# Patient Record
Sex: Male | Born: 1935 | Race: White | Hispanic: No | Marital: Married | Smoking: Never smoker
Health system: Western US, Academic
[De-identification: ages and names within clinical notes are randomized; demographics above are authoritative.]

---

## 2015-06-07 ENCOUNTER — Ambulatory Visit
Admit: 2015-06-07 | Discharge: 2015-06-07 | Payer: MEDICARE | Attending: Ophthalmology | Primary: Student in an Organized Health Care Education/Training Program

## 2015-06-07 DIAGNOSIS — H259 Unspecified age-related cataract: Secondary | ICD-10-CM

## 2015-06-07 MED ORDER — PHENYLEPHRINE HCL 2.5 % OP SOLN
2.5 % | Freq: Once | OPHTHALMIC | Status: AC
Start: 2015-06-07 — End: 2015-06-07
  Administered 2015-06-07: 19:00:00 1 [drp] via OPHTHALMIC

## 2015-06-07 MED ORDER — FLUORESCEIN-BENOXINATE 0.25-0.4 % OP SOLN
Freq: Once | OPHTHALMIC | Status: AC
Start: 2015-06-07 — End: 2015-06-07
  Administered 2015-06-07: 19:00:00 1 [drp] via OPHTHALMIC

## 2015-06-07 MED ORDER — TROPICAMIDE 1 % OP SOLN
1 % | Freq: Once | OPHTHALMIC | Status: AC
Start: 2015-06-07 — End: 2015-06-07
  Administered 2015-06-07: 19:00:00 1 [drp] via OPHTHALMIC

## 2015-06-07 NOTE — Progress Notes (Signed)
OPHTHALMOLOGY VISIT  Full Eye Exam    Date of Service:  06/07/2015  Primary Care Provider:  No primary care provider on file.    Patient Active Problem List    Diagnosis Date Noted   ??? Senile cataracts of both eyes 06/07/2015     Assessment and Plan:     1. Senile cataracts of both eyes      visually significant cataracts OU  f/u 66mo to discuss surgical options, BAT  MRx given  need to assess capacity  DFE 78yr       Return for 1 month general with Awender, BAT, IOP.    ______________________________________________________________________  Chief Complaint: Annual Exam      History of Present illness:     HPI     Pt here for annual exam. Last exam 03/2014. No changes with vision OU. Doesn't wear glasses. No dryness, tearing, itching, burning. History somewhat limited 2/2 pt intellectual disability.        Review of Systems:   Review of Systems   Constitutional: Negative for chills, diaphoresis, fatigue and fever.   HENT: Negative for congestion, rhinorrhea and sore throat.    Eyes: Negative for visual disturbance.   Respiratory: Negative for cough, shortness of breath and wheezing.    Cardiovascular: Negative for chest pain, palpitations and leg swelling.   Gastrointestinal: Negative for abdominal pain, blood in stool, constipation, diarrhea, nausea and vomiting.   Genitourinary: Negative for dysuria, frequency, hematuria and urgency.   Neurological: Negative for syncope, weakness, light-headedness, numbness and headaches.   Psychiatric/Behavioral: Negative for dysphoric mood. The patient is not nervous/anxious.       Medical/Surgical History:     Past Medical History   Diagnosis Date   ??? Anxiety    ??? Attention deficit disorder    ??? Bipolar 1 disorder (HCC)    ??? High cholesterol    ??? Mental retardation      History reviewed. No pertinent past surgical history.  Drug Allergies:   No Known Allergies  Medications:     Current Outpatient Prescriptions   Medication Sig Dispense Refill   ??? rosuvastatin (CRESTOR) 10 MG  tablet Take 10 mg by mouth daily     ??? fluvoxaMINE maleate (LUVOX CR) 100 MG CP24 capsule Take 100 mg by mouth nightly     ??? nystatin 100000 UNIT/GM POWD Apply topically daily     ??? QUEtiapine (SEROQUEL) 25 MG tablet Take 25 mg by mouth 2 times daily     ??? carBAMazepine (TEGRETOL) 200 MG tablet Take 200 mg by mouth 2 times daily     ??? acetaminophen (TYLENOL) 500 MG tablet Take 500 mg by mouth every 6 hours as needed for Pain       No current facility-administered medications for this visit.       Vital Signs:    There were no vitals filed for this visit.  Physical Exam:     Visual Acuity ([employer])      Right Left   Dist sc 20/80 20/100            Not recorded        Final Rx      Sphere Cylinder Axis   Right +2.50 +0.25 070   Left +1.75 +1.00 116           Pupils      Pupils Dark Light APD   Right PERRL 4mm 3mm None   Left PERRL 4mm 3mm None  Extraocular Movement     Extraocular Movement      Right Left   Result Full Full                 Confrontational Visual Fields     Visual Fields     Unable               Tonometry (app, 2:41 PM)      Right Left   Pressure 16 20            Not recorded        Dilation     Dilation     Both eyes:  2.5% Phenylephrine HCL, 1% Tropicamide @ 2:42 PM              Main Ophthalmology Exam     Slit Lamp Exam      Right Left    Lids/Lashes Dermatochalasis - upper lid Dermatochalasis - upper lid    Conjunctiva/Sclera White and quiet White and quiet    Cornea arcus senilis arcus senilis    Anterior Chamber Deep and quiet Deep and quiet    Iris dilated, 8mm dilated, 8mm    Lens 3+ Nuclear sclerosis, no target sign 3+ Nuclear sclerosis, no target sign      Fundus Exam      Right Left    Vitreous Normal Normal    Disc Normal, no heme Normal    C/D Ratio 0.2 0.2    Macula Normal Normal    Vessels Normal Normal    Periphery Normal Normal              No annotated images are attached to the encounter.    Return for 1 month general with Awender, BAT, IOP.

## 2015-07-13 ENCOUNTER — Encounter: Primary: Student in an Organized Health Care Education/Training Program

## 2015-07-13 NOTE — Telephone Encounter (Signed)
Patient no-showed today's appointment; {no show notification:[id number]::"provider notified for review of record chart on your desk

## 2015-07-14 NOTE — Telephone Encounter (Signed)
Please have patient follow-up at next available clinic with Dr. Margretta Sidle for BAT and IOP and further cataract surgery evaluation. Appointment should be within 2 weeks.

## 2015-07-15 NOTE — Telephone Encounter (Signed)
Sent pt card and called

## 2015-10-05 ENCOUNTER — Ambulatory Visit
Admit: 2015-10-05 | Discharge: 2015-10-05 | Payer: MEDICARE | Attending: Ophthalmology | Primary: Student in an Organized Health Care Education/Training Program

## 2015-10-05 DIAGNOSIS — H259 Unspecified age-related cataract: Secondary | ICD-10-CM

## 2015-10-05 NOTE — Progress Notes (Signed)
OPHTHALMOLOGY VISIT  Full Eye Exam    Date of Service:  10/05/2015  Primary Care Provider:  No primary care provider on file.    Patient Active Problem List    Diagnosis Date Noted   ??? Meibomian gland dysfunction (MGD) 10/05/2015   ??? Pinguecula, left eye 10/05/2015   ??? Dermatochalasis of both upper eyelids 10/05/2015   ??? Senile cataracts of both eyes 06/07/2015     Assessment and Plan:     1. Senile cataracts of both eyes     2. Refractive error     3. Dermatochalasis of both upper eyelids     4. Pinguecula, left eye     5. Meibomian gland dysfunction (MGD)       1. Likely to be visually significant. Patient not interested in surgery at this time.    Not interfering with daily activities. Patient does not drive.    2. VA improves significantly with refraction today. MRx given.    3. Patient may benefit from evaluation in oculoplastics clinic although it is doubtful that excess skin is interfering with VF at this time.    4. Nasal OS. Adjacent to limbus; not encroaching upon corneal surface.    5. WC QD. AT TID and PRN.    Return in about 1 year (around 10/04/2016) for Gen, IOP, MRx, DOA OU, Long Check.    ______________________________________________________________________  Chief Complaint: Consult- Cataract      History of Present illness:     HPI     80 year old white male presents to office today for 1 mos f/u for cataract evaluation ou-pt denies concerns with va ou. Pt states he is able to do all his normal activities.        Review of Systems:   Review of Systems   Medical/Surgical History:     Past Medical History   Diagnosis Date   ??? Anxiety    ??? Attention deficit disorder    ??? Bipolar 1 disorder (HCC)    ??? High cholesterol    ??? Mental retardation      History reviewed. No pertinent past surgical history.  Drug Allergies:   No Known Allergies  Medications:     Current Outpatient Prescriptions   Medication Sig Dispense Refill   ??? rosuvastatin (CRESTOR) 10 MG tablet Take 10 mg by mouth daily     ??? fluvoxaMINE  maleate (LUVOX CR) 100 MG CP24 capsule Take 100 mg by mouth nightly     ??? nystatin 100000 UNIT/GM POWD Apply topically daily     ??? QUEtiapine (SEROQUEL) 25 MG tablet Take 25 mg by mouth 2 times daily     ??? carBAMazepine (TEGRETOL) 200 MG tablet Take 200 mg by mouth 2 times daily     ??? acetaminophen (TYLENOL) 500 MG tablet Take 500 mg by mouth every 6 hours as needed for Pain       No current facility-administered medications for this visit.       Vital Signs:    There were no vitals filed for this visit.  Physical Exam:     Visual Acuity ([employer] - Linear)      Right Left   Dist sc 20/60 -2 20/70       Rotating E's        Ophthalmology Exam     Wearing Rx     Type:  none              Final Rx  Sphere Cylinder Axis   Right +2.50 +0.25 070   Left +1.75 +1.00 110              Not recorded         Not recorded        Tonometry     Unable to assess:  Yes         Not recorded         Not recorded        Main Ophthalmology Exam     External Exam      Right Left    External Normal Normal      Slit Lamp Exam      Right Left    Lids/Lashes 1+ Dermatochalasis - upper lid, 1+ Meibomian gland dysfunction 1+ Dermatochalasis - upper lid, 1+ Meibomian gland dysfunction    Conjunctiva/Sclera White and quiet White and quiet    Cornea Arcus Arcus    Anterior Chamber Deep and quiet Deep and quiet    Iris Round and reactive Round and reactive    Lens 3+ Nuclear sclerosis, no target sign 3+ Nuclear sclerosis, no target sign    Vitreous Normal Normal              No annotated images are attached to the encounter.    Return in about 1 year (around 10/04/2016) for Gen, IOP, MRx, DOA OU, Long Check.    Patient evaluated with Dr. Margretta SidleAwender, attending ophthalmologist.    Marry GuanJason Sherley Leser, M.D.  Resident Physician, PGY III  Department of Ophthalmology  [facility]  [address], [address]  [place], [place] [id number]

## 2016-01-12 ENCOUNTER — Ambulatory Visit: Payer: Self-pay | Admitting: Neurology

## 2016-01-26 ENCOUNTER — Ambulatory Visit: Payer: Self-pay

## 2016-03-06 ENCOUNTER — Ambulatory Visit: Payer: Self-pay | Admitting: Neurology

## 2016-03-07 ENCOUNTER — Ambulatory Visit: Payer: Self-pay | Admitting: Neurology

## 2016-04-05 ENCOUNTER — Ambulatory Visit: Payer: Self-pay | Admitting: Neurology

## 2016-08-13 ENCOUNTER — Inpatient Hospital Stay: Admit: 2016-08-13 | Discharge: 2016-08-13 | Disposition: A | Discharge status: Home / Self Care

## 2016-08-13 DIAGNOSIS — S60229A Contusion of unspecified hand, initial encounter: Secondary | ICD-10-CM

## 2016-08-13 NOTE — ED Provider Notes (Signed)
Ernest HaberMichael Khaza Blansett, MD  08/13/16 [phone]

## 2016-08-13 NOTE — ED Notes (Signed)
Discharge instructions given to pt./ pt's caretaker verbalized understanding of instructions and follow-up care.No Prescription given.       Joee Iovine S. JamaicaFrench, RN  08/13/16 [phone]

## 2016-08-13 NOTE — Other (Unsigned)
Patient Acct Nbr: [id number]   Primary AUTH/CERT:   Primary Insurance Company Name: [employer]  Primary Insurance Plan name: Medicare A  Primary Insurance Group Number:   Primary Insurance Plan Type: [employer]  Primary Insurance Policy Number: [id number]    Secondary AUTH/CERT:   Secondary Insurance Company Name: [employer]  Secondary Insurance Plan name: Medicare B  Secondary Insurance Group Number:   Secondary Insurance Plan Type: [employer] B  Secondary Insurance Policy Number: [id number]    Tertiary AUTH/CERT:   [employer]iary Insurance Company Name: [employer]  Tertiary Insurance Plan name: [employer]  Tertiary Insurance Group Number:   [employer] Insurance Plan Type: [employer]  Tertiary Insurance Policy Number: [id number]

## 2016-08-13 NOTE — ED Provider Notes (Signed)
eMERGENCY dEPARTMENT eNCOUnter      PCP: No primary care provider on file.    CHIEF COMPLAINT    Chief Complaint   Patient presents with   ??? Hand Pain     left       HPI    Phillip Morrow is a 81 y.o. male who presents with fall While playing tennis yesterday. It is reported that he was with one of his home health caregivers today upon waking he had bruising pain and swelling in the LEFT hand, abrasion to the RIGHT knee and a hematoma to the RIGHT forehead region. The caregiver who is with him today states that she is not sure the nature of the fall because she did not witness it, he only thing that was reported yesterday evening was that he hurt his knee. The patient has a history of MRDD but is able to provide some history. He denies any loss consciousness or any nausea or vomiting. He denies neck or back pain. The caregiver confirms that he has had no complaints and is acting per his usual self      Onset was prior to arrival. The reason why the patient fell (context) was    The fall was mechanical in nature without preceding symptoms.    REVIEW OF SYSTEMS    General: No Fever  ENT:  No visual changes.  No headache.  Cardiac: No Chest Pain, no syncope  Respiratory: No cough or difficulty breathing  GI: No vomiting.  No Bloody Stool or Diarrhea  GU: No Dysuria or Hematuria  MSKTL:  See HPI.   No neck or back pain.  Neurologic:No LOC, no headache, dizziness, confusion.  No hearing loss    See HPI and nursing notes for additional information     PAST MEDICAL & SURGICAL HISTORY    Past Medical History:   Diagnosis Date   ??? Anxiety    ??? Attention deficit disorder    ??? Bipolar 1 disorder (HCC)    ??? High cholesterol    ??? Mental retardation      No past surgical history on file.    CURRENT MEDICATIONS    Current Outpatient Rx   Medication Sig Dispense Refill   ??? rosuvastatin (CRESTOR) 10 MG tablet Take 10 mg by mouth daily     ??? fluvoxaMINE maleate (LUVOX CR) 100 MG CP24 capsule Take 100 mg by mouth nightly     ???  nystatin 100000 UNIT/GM POWD Apply topically daily     ??? QUEtiapine (SEROQUEL) 25 MG tablet Take 25 mg by mouth 2 times daily     ??? carBAMazepine (TEGRETOL) 200 MG tablet Take 200 mg by mouth 2 times daily     ??? acetaminophen (TYLENOL) 500 MG tablet Take 500 mg by mouth every 6 hours as needed for Pain         ALLERGIES    No Known Allergies    SOCIAL & FAMILY HISTORY    Social History     Social History   ??? Marital status: Single     Spouse name: N/A   ??? Number of children: N/A   ??? Years of education: N/A     Social History Main Topics   ??? Smoking status: Former Smoker   ??? Smokeless tobacco: Never Used   ??? Alcohol use No   ??? Drug use: No   ??? Sexual activity: Not on file     Other Topics Concern   ??? Not on  file     Social History Narrative   ??? No narrative on file     Family History   Problem Relation Age of Onset   ??? Family history unknown: Yes       PHYSICAL EXAM    VITAL SIGNS: BP 103/62    Pulse 66    Temp 97.6 ??F (36.4 ??C) (Oral)    Resp 16    Ht 5\' 5"  (1.651 m)    Wt 71.7 kg (158 lb)    SpO2 98%    BMI 26.29 kg/m??    Constitutional:  Well developed, well nourished, no acute distress   Eyes: EOMI.  PERRL, sclera nonicteric.  Anterior chambers clear.   Funduscopic exam without any gross abnormality or hemorrhages.   HENT:  There is an ache states the area of ecchymosis that is very mildly tender to palpate over the RIGHT forehead region. There is no periorbital tenderness no other facial bone tenderness. no trismus.   Ears canals and TMs free of blood or clear fluid.  Nasal passages and oropharynx free of blood or clear fluid.    Neck/Lymphatics: supple, no JVD, no swollen nodes. No posterior neck tenderness.  Range of motion without obvious pain or deficit.  Respiratory:  Lungs Clear, no retractions   Cardiovascular:  Regular rate, no murmurs  GI:  Soft, nontender, normal bowel sounds  Musculoskeletal:  No edema,   The LEFT hand with ecchymosis over the dorsum that extends to the wrist. The dorsum of hand and  volar aspect of the wrist are mildly tender to palpate. There is no palpable deformity or crepitus. Range of motion is mildly limited due to pain.  The RIGHT knee has no tenderness to palpation, there are 2 small abrasions noted with mild soft tissue swelling. Range of motion fully intact. No varus or valgus laxity. Flexion and extension intact without pain.  Integument:  Well hydrated, no petechiae     Neurologic:    - Alert & oriented person, place, time, and situation,   no speech difficulties or slurring.  - No obvious gross motor deficits  - Cranial nerves 2-12 grossly intact  - Negative meningeal signs.  - Sensation intact to light touch  - Strength 5/5 in upper and lower extremities bilaterally  - Normal finger to nose test bilaterally  - Rapid alternating movements intact  - Normal heel-shin bilaterally  - No pronator drift.  - Light touch sensation intact throughout.  - Upper and lower extremity DTRs 2+ bilaterally.  - Gait steady and without difficulty    Psych: Pleasant affect, no hallucinations      RADIOLOGY   Imaging Results     ??   XR HAND LEFT (MIN 3 VIEWS) (Final result)   Result time 08/13/16 14:23:24   Final result by Jenkins Rouge, DO (08/13/16 14:23:24)                Narrative:          Patient Name: ??RIP, HAWES   MRN: ??[id number]   FIN: ??[id number]       ---Diagnostic Radiology---     Exam Date/Time ?? ?? ?? ??08/13/2016 14:06:15 EDT ?? ?? ?? ?? ?? ?? ?? ?? ?? ?? ?? ?? ?? ??   Exam ?? ?? ?? ?? ?? ?? ?? ?? ??CR Hand Complete 3+ Views Left ?? ?? ?? ?? ?? ?? ?? ?? ?? ?? ??  Ordering Physician ?? ??Lavalle Skoda, NP-C, Braxtyn Bojarski A ?? ?? ?? ?? ?? ?? ?? ?? ?? ?? ?? ?? ?? ?? ??  Accession Number ?? ?? ??[id number] ?? ?? ?? ?? ?? ?? ?? ?? ?? ?? ?? ?? ?? ?? ?? ?? ?? ?? ??     CPT4 Codes   73130 ()       Reason For Exam   pain       Report     LEFT HAND:     CLINICAL INDICATION: Pain.     TECHNIQUE: PA, Lat, and oblique     COMPARISON: Left wrist from earlier today     FINDINGS: There is no fracture or dislocation. Degenerative changes   are seen at the first [facility] and  radiocarpal joints, as well as the   second through fifth DIPs. ??Calcification is seen in the TFCC. No bone   lesion is identified. ??There is no soft tissue abnormality. ?? ?? ??    IMPRESSION: Degenerative changes, including calcifications in the TFCC   which may be seen in CPPD arthropathy.     Report Dictated on Workstation: [id number]   ---** Final ---**     Dictated: 08/13/2016 2:22 pm   Dictating Physician: Victoriano Lain, MD, NICHOLAS   Signed Date and Time: 08/13/2016 2:23 pm   Signed by: Victoriano Lain, MD, NICHOLAS   Transcribed Date and Time: 08/13/2016 2:22            ??   ??   XR WRIST LEFT 3 VW (Final result)   Result time 08/13/16 14:07:55   Final result by Rosalita Chessman, MD (08/13/16 14:07:55)                Narrative:          Patient Name: ??ABANOUB, HANKEN   MRN: ??[id number]   FIN: ??[id number]       ---Diagnostic Radiology---     Exam Date/Time ?? ?? ?? ??08/13/2016 14:06:15 EDT ?? ?? ?? ?? ?? ?? ?? ?? ?? ?? ?? ?? ?? ??   Exam ?? ?? ?? ?? ?? ?? ?? ?? ??CR Wrist Complete 3 Views Left ?? ?? ?? ?? ?? ?? ?? ?? ?? ?? ??  Ordering Physician ?? ??Deedee Lybarger, NP-C, Zelma Mazariego A ?? ?? ?? ?? ?? ?? ?? ?? ?? ?? ?? ?? ?? ?? ??   Accession Number ?? ?? ??[id number] ?? ?? ?? ?? ?? ?? ?? ?? ?? ?? ?? ?? ?? ?? ?? ?? ?? ?? ??     CPT4 Codes   73110 ()       Reason For Exam   pain       Report   Indication: Injury left wrist.     IMPRESSION: Left wrist three views. Bone density normal. No displaced   fracture or dislocation seen.     Some degenerative change noted at the radiocarpal joint and first   carpometacarpal joint.     No acute bone process seen.     Report Dictated on Workstation: [id number]   ---** Final ---**     Dictated: 08/13/2016 2:06 pm   Dictating Physician: Jack Quarto MD, Jonny Ruiz   Signed Date and Time: 08/13/2016 2:07 pm   Signed by: Jack Quarto, MD, [facility]   Transcribed Date and Time: 08/13/2016 2:06            ??   ??   XR KNEE RIGHT (MIN 4 VIEWS) (Final result)   Result time 08/13/16 14:08:34   Final result by Sheryle Hail, MD (08/13/16 14:08:34)                Narrative:  Patient Name: ??Clista BernhardtWILLIAMS, Lorry  S   MRN: ??[id number]   FIN: ??[id number]       ---Diagnostic Radiology---     Exam Date/Time ?? ?? ?? ??08/13/2016 14:06:15 EDT ?? ?? ?? ?? ?? ?? ?? ?? ?? ?? ?? ?? ?? ??   Exam ?? ?? ?? ?? ?? ?? ?? ?? ??CR Knee Complete 4+ Views Right ?? ?? ?? ?? ?? ?? ?? ?? ?? ??   Ordering Physician ?? ??Jarrius Huaracha, NP-C, Alondra Sahni A ?? ?? ?? ?? ?? ?? ?? ?? ?? ?? ?? ?? ?? ?? ??   Accession Number ?? ?? ??[id number] ?? ?? ?? ?? ?? ?? ?? ?? ?? ?? ?? ?? ?? ?? ?? ?? ?? ?? ??     CPT4 Codes   [id number] ()       Reason For Exam   pain       Report   HISTORY: Pain. Injury while playing tennis.     AP, lateral, tunnel, and patellar plain film views of the right knee   were obtained. ??     Comparisons available: None     FINDINGS:     There is no acute fracture or dislocation. No joint effusion. Minor   soft tissue swelling over the anterior knee from a reported abrasion.     There are tricompartmental osteoarthritic changes noted. This is   evidenced by joint space narrowing, marginal osteophyte formation and   sclerosis. Fine calcific densities are noted in the joint space which   could relate to chondrocalcinosis.     No radiopaque foreign bodies. Bony mineralization appears normal.     IMPRESSION:     No acute fracture or dislocation.     Tricompartmental osteoarthritis.     Question of chondrocalcinosis.     Report Dictated on Workstation: [id number]   ---** Final ---**     Dictated: 08/13/2016 2:06 pm   Dictating Physician: Sherral HammersSTAMATIS, MD, TOM A   Signed Date and Time: 08/13/2016 2:08 pm   Signed by: Sherral HammersSTAMATIS, MD, TOM A   Transcribed Date and Time: 08/13/2016 2:06            ??   ??   CT HEAD WO CONTRAST (Final result)   Result time 08/13/16 14:17:34   Final result by Rosalita ChessmanJohn Lahorra, MD (08/13/16 14:17:34)                Narrative:          Patient Name: ??Clista BernhardtWILLIAMS, Markelle S   MRN: ??[id number]   FIN: ??[id number]       ---CT---     Exam Date/Time ?? ?? ?? ??08/13/2016 13:50:00 EDT ?? ?? ?? ?? ?? ?? ?? ?? ?? ?? ?? ?? ?? ?? ??  Exam ?? ?? ?? ?? ?? ?? ?? ?? ??CT Head or Brain w/o Contrast ?? ?? ?? ?? ?? ?? ?? ?? ?? ?? ?? ??  Ordering Physician ?? ??Nakaya Mishkin, NP-C, Jacquel Mccamish A ??  ?? ?? ?? ?? ?? ?? ?? ?? ?? ?? ?? ?? ?? ?? ??  Accession Number ?? ?? ??[id number] ?? ?? ?? ?? ?? ?? ?? ?? ?? ?? ?? ?? ?? ?? ?? ?? ?? ?? ?? ??    CPT4 Codes   70450 ()       Reason For Exam   pain       Report   Indication: Head pain   No comparison.     FINDINGS: 3 mm unenhanced imaging of the brain performed. Images  viewed in multiple orthogonal planes.     There is no acute edema. No evidence of acute infarct.     No hemorrhage.     No abnormal extra-axial fluid collections.     No midline shift.     CSF spaces are unremarkable.     Orbits within normal limits.     Review of the paranasal sinuses shows no air-fluid levels.     IMPRESSION: No acute brain process identified.     Report Dictated on Workstation: [id number]   ---** Final ---**     Dictated: 08/13/2016 2:15 pm   Dictating Physician: Jack Quarto MD, Jonny Ruiz   Signed Date and Time: 08/13/2016 2:17 pm   Signed by: Jack Quarto, MD, [facility]   Transcribed Date and Time: 08/13/2016 2:15            ??       Procedure Note: Patient placed in a Velcro wrist splint splint by me.  Splint has good placement and affected extremity remains neurovascularly intact.    ED COURSE & MEDICAL DECISION MAKING       Vital signs and nursing notes reviewed during ED course.  I have independently evaluated this patient .  Supervising MD present in the Emergency Department, available for consultation, throughout entirety of  patient care.    Disposition and plan discussed at bedside with patient and/or the family today.  All pertinent Lab data and radiographic results reviewed with patient at bedside.       The patient and/or the family were informed of the results of any tests/labs/imaging, the treatment plan, and time was allotted to answer questions.     Differential Diagnosis: Cardiac Arrhythmia, Stroke, Sepsis/Infection, Anemia, Fracture, Dislocation, other.      Patient presents to emergency room with complaint of wrist pain, knee abrasion and contusion to head secondary to falling while playing tennis with his caretaker  yesterday. He is not on blood thinners. This morning caregiver reports that he is acting per his usual self.   On today's examination there is no evidence of focal neurological deficit. There is a abrasion to the knee with no bony tenderness as well as a contusion to the LEFT dorsum of hand and wrist with very mild tenderness but no range of motion deficit. All extremities are neurovascularly intact. No sign of acute tendon or nerve injury, no sign of compartment syndrome. He has no evidence of skull fracture or cervical spine fracture.  A CT of the brain was performed. As well as x-rays of the hand and wrist and knee. Velcro wrist splint applied and rice instructions provided. As well as wound care. Tetanus updated today. We recommended symptomatic treatment and that he is not allowed to be involved in any contact sports or anything that may exacerbate the headaches. He should follow up with a PCP, and/or return to our ED if there is worsening headache, neurological symptoms, or any other concerns that he has.      Clinical  IMPRESSION    1. Contusion of hand, unspecified laterality, initial encounter    2. Abrasion of knee, unspecified laterality, initial encounter    3. Closed head injury, initial encounter        PCP follow-up in 48 hours for recheck. Close return precautions discussed including returning immediately with new or worsening symptoms.    Diagnosis and plan discussed in detail with patient who understands and agrees.  Return to emergency Department precautions were discussed in detail with patient and include worsening  pain, new symptoms.      Comment: Please note this report has been produced using speech recognition software and may contain errors related to that system including errors in grammar, punctuation, and spelling, as well as words and phrases that may be inappropriate. If there are any questions or concerns please feel free to contact the dictating provider for clarification.     Loura Back, CNP  08/13/16 1535

## 2017-08-16 ENCOUNTER — Ambulatory Visit
Admit: 2017-08-16 | Discharge: 2017-08-16 | Payer: MEDICARE | Attending: Student in an Organized Health Care Education/Training Program | Primary: Student in an Organized Health Care Education/Training Program

## 2017-08-16 DIAGNOSIS — H25813 Combined forms of age-related cataract, bilateral: Secondary | ICD-10-CM

## 2017-08-16 MED ORDER — FLUORESCEIN-BENOXINATE 0.25-0.4 % OP SOLN
Freq: Once | OPHTHALMIC | Status: AC
Start: 2017-08-16 — End: 2017-08-16
  Administered 2017-08-16: 14:00:00 1 [drp] via OPHTHALMIC

## 2017-08-16 MED ORDER — TROPICAMIDE 1 % OP SOLN
1 % | Freq: Once | OPHTHALMIC | Status: AC
Start: 2017-08-16 — End: 2017-08-16
  Administered 2017-08-16: 14:00:00 1 [drp] via OPHTHALMIC

## 2017-08-16 MED ORDER — PHENYLEPHRINE HCL 2.5 % OP SOLN
2.5 % | Freq: Once | OPHTHALMIC | Status: AC
Start: 2017-08-16 — End: 2017-08-16
  Administered 2017-08-16: 14:00:00 1 [drp] via OPHTHALMIC

## 2017-08-16 NOTE — Assessment & Plan Note (Signed)
   Patient not bothered by this, visual axis unobstructed.

## 2017-08-16 NOTE — Assessment & Plan Note (Signed)
   May be visually significant, but patient denies symptoms. Recheck yearly.   Patient instructed to speak with someone at facility about making an appointment if his vision worsens.

## 2017-08-16 NOTE — Assessment & Plan Note (Signed)
   New MRx given. Encouraged wear of glasses.

## 2017-08-16 NOTE — Progress Notes (Signed)
OPHTHALMOLOGY VISIT  Full Eye Exam    Date of Service:  08/16/2017  Primary Care Provider:  No primary care provider on file.    Patient Active Problem List    Diagnosis Date Noted   . 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 Meibomian gland dysfunction (MGD) 10/05/2015   . Pinguecula, left eye 10/05/2015   . Dermatochalasis of both upper eyelids 10/05/2015   . Senile cataracts of both eyes 06/07/2015       Assessment and Plan:      Diagnosis Orders   1. Combined forms of age-related cataract of both eyes     2. Dermatochalasis of both upper eyelids     3. 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 Recheck yearly.   Patient instructed to speak with someone at facility about making an appointment if his vision worsens.    Dermatochalasis of both upper eyelids   Patient not bothered by this, visual axis unobstructed.    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 Encouraged wear of glasses.      To call if any changes.    Return in about 1 year (around 08/16/2018) for Gen, IOP, MRx, BAT, DOA OU, Long Check.    Discussed and seen with Dr. Elberta Leatherwood.    Arlyss Repress, MD  [facility] Ophthalmology - PGY III  Office: [phone]  Pager: [phone]  08/16/17  ---------------------------------------------------------------------------------------------------------------------     Chief Complaint: Eye Exam      History of Present illness:     HPI     Pt has case manager Verdon Cummins with him today at visit. Pt lives in a group home setting. Pt states he has no glasses, never got glasses, states doesn't want glasses because he does not need them. Pt likes to paint, does crafts and runs a shredder at work and pt states can see all of this fine. No complaints.        Review of Systems:   Review of Systems   HENT: Negative for sinus pain, sinus pressure and  sore throat.    Respiratory: Negative for cough and shortness of breath.    Cardiovascular: Negative for chest pain and palpitations.   Musculoskeletal: Negative for back pain and gait problem.   Neurological: Negative for dizziness, weakness and headaches.      Medical/Surgical History:     Past Medical History:   Diagnosis Date   . Anxiety    . Attention deficit disorder    . Bipolar 1 disorder (HCC)    . Cataract    . High cholesterol    . Mental retardation      History reviewed. No pertinent surgical history.  Drug Allergies:   No Known Allergies  Medications:     Prior to Admission medications    Medication Sig Start Date End Date Taking? Authorizing Provider   b complex vitamins capsule Take 1 capsule by mouth daily   Yes Historical Provider, MD   ferrous sulfate 325 (65 Fe) MG tablet Take 325 mg by mouth daily (with breakfast)   Yes Historical Provider, MD   calcium-vitamin D (OSCAL) 250-125 MG-UNIT per tablet Take 1 tablet by mouth daily   Yes Historical Provider, MD   rosuvastatin (CRESTOR) 10 MG tablet Take 10 mg by mouth daily  Yes Historical Provider, MD   fluvoxaMINE maleate (LUVOX CR) 100 MG CP24 capsule Take 100 mg by mouth nightly   Yes Historical Provider, MD   nystatin 100000 UNIT/GM POWD Apply topically daily   Yes Historical Provider, MD   QUEtiapine (SEROQUEL) 25 MG tablet Take 25 mg by mouth 2 times daily   Yes Historical Provider, MD   carBAMazepine (TEGRETOL) 200 MG tablet Take 200 mg by mouth 2 times daily   Yes Historical Provider, MD   acetaminophen (TYLENOL) 500 MG tablet Take 500 mg by mouth every 6 hours as needed for Pain   Yes Historical Provider, MD      Vital Signs:    There were no vitals filed for this visit.  Physical Exam:     Visual Acuity (Tumbling E 's)       Right Left    Dist sc 20/70 20/100    Dist ph sc 20/40 20/30    Near sc J3 J2        Ophthalmology Exam     Wearing Rx       Sphere    Right none    Left none              Final Rx       Sphere Cylinder Axis Dist [place]  Add Near [place]    Right +2.50 +0.25 070 20/20 +2.50 J1    Left +1.75 +1.00 110 20/30 +2.50 J1    Expiration Date:  08/17/2019        Pupils       Dark Light Shape React APD    Right 4 2 Round Brisk None    Left 4 2 Round Brisk None        Extraocular Movement     Extraocular Movement       Right Left     Full Full                Confrontational Visual Fields     Visual Fields     unable              Tonometry (Tonopen, 10:00 AM)       Right Left    Pressure 20 19         Not recorded        Dilation     Dilation     Both eyes:  2.5% Phenylephrine HCL, 1% Tropicamide @ 10:00 AM              Main Ophthalmology Exam     External Exam       Right Left    External Normal Normal          Slit Lamp Exam       Right Left    Lids/Lashes 2+ Dermatochalasis - upper lid, 1+ Meibomian gland dysfunction 2+ Dermatochalasis - upper lid, 1+ Meibomian gland dysfunction    Conjunctiva/Sclera White and quiet White and quiet    Cornea Arcus Arcus    Anterior Chamber Deep and quiet Deep and quiet    Iris Round and dilated Round and dilated    Lens 2+ Nuclear sclerosis, 1+ Cortical cataract 2+ Nuclear sclerosis, Cortical spokes, pigment on posterior capsule          Fundus Exam       Right Left    Vitreous Vitreous syneresis Vitreous syneresis    Disc Peripapillary atrophy Peripapillary atrophy    C/D Ratio 0.2 0.2  Macula Normal Normal    Vessels Normal Normal    Periphery Normal Normal    Poor cooperation- limited peripheral exam              No annotated images are attached to the encounter.

## 2017-08-16 NOTE — Other (Unsigned)
Patient Acct Nbr: [id number]   Primary AUTH/CERT:   Primary Insurance Company Name: [employer]  Primary Insurance Plan name: Medicare A  Primary Insurance Group Number:   Primary Insurance Plan Type: [employer]ar[employer] manager[employer]care A  Primary Insurance Policy Number: [id number]    Secondary AUTH/CERT:   Secondary Insurance Company Name: [employer]  Secondary Insurance Plan name: Medicare B  Secondary Insurance Group Number:   Secondary Insurance Plan Type: Journalist, newspaperMcare B  Secondary Insurance Policy Number: [id number]    Tertiary AUTH/CERT:   [employer]iary Insurance Company Name: [employer]  Tertiary Insurance Plan name: [employer]  Tertiary Insurance Group Number:   [employer]iary Insurance Plan Type: [employer]  Tertiary Insurance Policy Number: [id number]

## 2018-03-17 NOTE — Progress Notes (Signed)
Jones Skene M.D.  Board Certified in Neurology and Sleep Medicine  [facility] [place], [place]   [phone] Fax [phone]        DATE OF VISIT: 03/18/2018    PATIENT NAME:  Matthew Greene  DATE OF BIRTH: 1935-07-05    CHIEF COMPLAINT: Follow-up regarding headaches and trigeminal neuralgia; last visit 03/06/2016    SUBJECTIVE:   Matthew Greene has had return of trigeminal neuralgia on the left side of the face over the last month or longer.  He had not had any pain in several months.  The jolts are occurring 3 to 4 times per day generally.  They last briefly, though sometimes longer.  The sensations are similar to what he experienced previously.  He is not aware of any triggers, though is concerned he could have a sinus infection as his eyes burn and he has a headache and head pressure.  There is tenderness in a focal spot in the left frontal area around the area of the temporal artery.  He reminded me that he previously had a temporal artery biopsy.  He is scheduled to see Dr. Michaela Corner next week.    He has had a low-grade fever ranging from 99.4 to 99.5.  He was afebrile when he was seen by his primary care doctor recently as well as today in our office.  He denies any sensitivity to combing his hair and denies any difficulties with chewing.  He is able to brush his teeth and speak without any pain onset.  He is taking gabapentin 100 mg b.i.d. which he thinks helps decrease the frequency of the pain. If he has pain in the middle of the day, he will take an additional capsule of gabapentin 100 mg.    There have been no changes in his health since I last saw him.    PAST MEDICAL HISTORY:  hyperlipidemia  history of Clostridium difficile    PAST SURGICAL HISTORY:  status post temporal artery biopsy, left 2012  appendectomy, 1960  left shoulder decompression, 1997  back surgery L 4/L5 - 2001, done by Dr. Maxie Barb  cholecystectomy, 2010    CURRENT MEDICATIONS:      Current  Outpatient Medications:   .  aspirin 81 MG EC tablet, 1 tablet by Oral route daily., Disp: , Rfl:   .  cholecalciferol (VITAMIN D) 1000 UNIT tablet, Take 1,000 Units by mouth daily., Disp: , Rfl:   .  gabapentin (NEURONTIN) 100 MG capsule, 1 capsule by Oral route 2 times daily., Disp: , Rfl:       ALLERGIES:  Ciprofloxacin      REVIEW OF SYSTEMS:  A 10 point review of systems can be found in the chart and was reviewed with the patient. Pertinent positives include weight change; fatigue; headaches; pain; lightheadedness; sinus problems. All other review of systems is negative.      OBJECTIVE:    EXAMINATION:  BP 158/84 (BP Location: Left arm, BP Patient Position: Sitting, BP cuff size: Regular)   Pulse 72   Temp 98.1 F (36.7 C) (Oral)   Resp 18   Ht 5' 6.5" (1.689 m)   Wt 93.9 kg (207 lb)   BMI 32.91 kg/m  Temperature: 98.1 F (36.7 C)  General: Well-developed, well-nourished and in no acute distress.  Chest:  Lungs are clear to auscultation bilaterally.  Cardiovascular: Regular rhythm. No murmurs, gallops, or rubs noted.   Abdomen:  Soft, non-tender, non-distended. No  masses or organomegaly by palpation.  Extremities: No clubbing or cyanosis.   Skin: No evidence of rash or acute skin change.  Mental status: Awake and alert. Concentration and attention are intact. Speech is fluent with no evidence of dysarthria and aphasia.  Cranial nerves: Face is grossly symmetric. Tongue protrudes in the midline.  Motor: Power is grossly full throughout the bilateral upper and lower extremities.  Gait: Narrow-based.        DIAGNOSTIC DATA:    Labs 02/01/2018:  CBC within normal limits.  CMP within normal limits except eGFR 57.  Sed rate 0.  Total cholesterol 208.  HDL 48.  LDL 138.  Triglycerides 108.  Hemoglobin A1c 5.9.  TSH 1.73.  Free T4 1.3.  Free T3 2.8.  Folate 7.9.  CRP 0.3.  Vitamin B12 668.  Vitamin-D 33.3.    MRI brain with and without contrast 03/07/2016 [facility]):  1. Normal brain MRI for age.  2. No acute  intracranial abnormality or suspicious enhancement.      CT NECK WITH AND WITHOUT CONTRAST  1. Unremarkable CT examination of the neck with and without contrast.  2. Mild to moderate degenerative changes in the cervical spine.  The mastoid air cells are clear. Visualized paranasal sinuses   including the maxillary, sphenoid, ethmoid and inferior aspects of   the frontal sinuses show no significant sinus disease. Minimal 0.2   cm mucosal thickening in the medial wall of the right maxillary   sinus. No air-fluid levels.    Labs 01/17/2016: Sed rate 2. CRP less than 0.4.     Labs 07/02/13:  ESR 2. GFR 69 (greater than 60)  CMP within normal limits CBC within normal limits with eosinophils mildly elevated at 9.3%.  vitamin B12 380. Folic acid 6.8..    MRI Brain without contrast 07/04/13:   Mild chronic microvascular changes and cerebral volume loss, age appropriate.   Mild mucosal thickening is noted at the maxillary sinuses bilaterally. Mild mucosal thickening is also noted in the ethmoid air cells. Frontal and sphenoid sinuses are clear. There is moderate bilateral turbinate hypertrophy.    MRI Thoracic spine without contrast 07/04/13:  1. Schmorl's node inferior endplate of T8. High T2 signal in the disc and vertebral body suggests acute of subacute end plate herniation. Correlate clinically for localized back pain.   2. Mild epidural lipomatosis mid thoracic spine resulting in mild to moderate central canal stenosis.     MRI cervical spine 07/04/13:  C3 - 4 level: Combined degenerative disc and facet changes result in moderate bilateral  foraminal stenosis and mild central canal stenosis.  C4 - 5 level: Facet arthropathy results in mild to moderate bilateral foraminal stenosis.  C5 - 6 level: Combined degenerative disc and facet changes result in moderately severe bilateral foraminal stenosis and mild central canal stenosis.    CT scan of the brain 06/23/13: No CT evidence of mass effect, extra-axial fluid collection or hemorrhage in the brain. No evidence of acute infarct, bleed or significant mass.            ASSESSMENT:  Matthew Greene is a very pleasant 83 year old man with a history of hyperlipidemia with trigeminal neuralgia that occurs relatively infrequently and responds well to gabapentin 100 to 200 mg 2 to 3 times per day.    There is associated left-sided head pain with a focal area of tenderness around the temporal artery.  Temporal arteritis is on the differential diagnosis, though sed rate and CRP were normal on  02/01/2018, though  he is not certain he was having pain at that time.  An underlying sinus issue or allergy issue could be contributing to his symptoms.  He is scheduled to see an allergist, Dr. Michaela Corner, next week.  MRI brain (02/2016)  showed no acute changes.       PLAN:  - I recommended he see Dr. Michaela Corner as planned next week as we discussed how a sinus infection or allergies could potentially contribute to trigeminal neuralgia pain.  -I provided a lab slip to check a sed rate and CRP which he will get done if Dr. Michaela Corner does not think there are any issues with allergies or sinus disease contributing to his symptoms.  I reviewed concerns of temporal arteritis and how this can potentially lead to permanent vision loss.  -He will continue with gabapentin 100 mg 2 times per day and up to 3 times per day if needed.  We discussed that higher dosing can be pursued, but given concern for potential side effects, I prefer to use the lowest dose possible.  He notes some sedation from the gabapentin.  -I would like to see him for follow-up in  approximately 6 weeks. I encouraged him to contact me with any worsening of symptoms, questions, or concerns he may have before then.        Greater than 25 minutes were spent with the patient, of which more than 50% of the visit was spent on counseling or coordination of care.      Please note: this note was generated using a voice recognition system.

## 2018-03-18 ENCOUNTER — Encounter: Payer: Self-pay | Admitting: Neurology

## 2018-03-18 ENCOUNTER — Ambulatory Visit (INDEPENDENT_AMBULATORY_CARE_PROVIDER_SITE_OTHER): Payer: Medicare Other | Admitting: Neurology

## 2018-03-18 VITALS — BP 158/84 | HR 72 | Temp 98.1°F | Resp 18 | Ht 66.5 in | Wt 207.0 lb

## 2018-03-18 DIAGNOSIS — G500 Trigeminal neuralgia: Secondary | ICD-10-CM

## 2018-03-18 DIAGNOSIS — I10 Essential (primary) hypertension: Secondary | ICD-10-CM

## 2018-03-18 DIAGNOSIS — R519 Headache, unspecified: Secondary | ICD-10-CM

## 2018-03-18 DIAGNOSIS — R51 Headache: Principal | ICD-10-CM

## 2018-03-18 MED ORDER — ASPIRIN EC 81 MG OR TBEC: 1.00 | DELAYED_RELEASE_TABLET | Freq: Every day | ORAL | Status: AC

## 2018-03-18 MED ORDER — VITAMIN D 1000 UNIT OR TABS: 1000.00 [IU] | ORAL_TABLET | Freq: Every day | ORAL | Status: AC

## 2018-03-18 MED ORDER — GABAPENTIN 100 MG OR CAPS
1.00 | ORAL_CAPSULE | Freq: Two times a day (BID) | ORAL | Status: DC
Start: ? — End: 2018-05-15

## 2018-05-14 NOTE — Progress Notes (Signed)
Jones Skene M.D.  Board Certified in Neurology and Sleep Medicine  [facility] [place], [place]   [phone] Fax [phone]        DATE OF VISIT: 05/15/2018    PATIENT NAME:  Matthew Greene  DATE OF BIRTH: 01-Jan-1935    CHIEF COMPLAINT: Follow-up regarding headache; last visit 03/18/2018    SUBJECTIVE:   Mr. Shadowens has not had symptoms of trigeminal neuralgia since I last saw him nearly 2 months ago.  His headache is gone as well.  He saw Dr. Michaela Corner who found no evidence of sinus infection.  Allergy testing was normal.    He is struggling with constipation related to pain medications which he was taking as he had 3 dental implants last week.  This did not affect his trigeminal neuralgia.  He is using a stool softener.    He denies other changes in his health.      PAST MEDICAL HISTORY:  hyperlipidemia  history of Clostridium difficile    PAST SURGICAL HISTORY:  status post temporal artery biopsy, left 2012  appendectomy, 1960  left shoulder decompression, 1997  back surgery L 4/L5 - 2001, done by Dr. Maxie Barb  cholecystectomy, 2010        CURRENT MEDICATIONS:      Current Outpatient Medications:   .  aspirin 81 MG EC tablet, 1 tablet by Oral route daily., Disp: , Rfl:   .  cholecalciferol (VITAMIN D) 1000 UNIT tablet, Take 1,000 Units by mouth daily., Disp: , Rfl:   .  gabapentin (NEURONTIN) 100 MG capsule, Take 100 mg by mouth daily as needed., Disp: , Rfl:       ALLERGIES:  Ciprofloxacin      REVIEW OF SYSTEMS:  A 10 point review of systems can be found in the chart and was reviewed with the patient and is unchanged.       OBJECTIVE:    EXAMINATION:  BP (!) 151/91 (BP Location: Right arm, BP Patient Position: Sitting, BP cuff size: Regular)   Pulse 74   Temp 98 F (36.7 C) (Oral)   Resp 19   Ht 5' 6.5" (1.689 m)   Wt 94.3 kg (208 lb)   BMI 33.07 kg/m  Temperature: 98 F (36.7 C)  General: Well-developed, well-nourished and in no acute distress.  Chest:  Lungs  are clear to auscultation bilaterally.  Cardiovascular: Regular rhythm. No murmurs, gallops, or rubs noted.   Abdomen:  Soft, non-tender, non-distended. No masses or organomegaly by palpation.  Extremities: No clubbing or cyanosis.   Skin:  Healing ecchymoses over the inferior aspect of the right mandible  Mental status: Awake and alert. Concentration and attention are intact. Speech is fluent with no evidence of dysarthria and aphasia.  Cranial nerves: Face is grossly symmetric.  Motor: Power is grossly full throughout the bilateral upper and lower extremities.  Gait: Narrow-based.        DIAGNOSTIC DATA:      Labs 02/01/2018:  CBC within normal limits.  CMP within normal limits except eGFR 57.  Sed rate 0.  Total cholesterol 208.  HDL 48.  LDL 138.  Triglycerides 108.  Hemoglobin A1c 5.9.  TSH 1.73.  Free T4 1.3.  Free T3 2.8.  Folate 7.9.  CRP 0.3.  Vitamin B12 668.  Vitamin-D 33.3.    MRI brain with and without contrast 03/07/2016 [facility]):  1. Normal brain MRI for age.  2. No acute intracranial abnormality or  suspicious enhancement.      CT NECK WITH AND WITHOUT CONTRAST  1. Unremarkable CT examination of the neck with and without contrast.  2. Mild to moderate degenerative changes in the cervical spine.  The mastoid air cells are clear. Visualized paranasal sinuses   including the maxillary, sphenoid, ethmoid and inferior aspects of   the frontal sinuses show no significant sinus disease. Minimal 0.2   cm mucosal thickening in the medial wall of the right maxillary   sinus. No air-fluid levels.    Labs 01/17/2016: Sed rate 2. CRP less than 0.4.      Labs 07/02/13:  ESR 2. GFR 69 (greater than 60)  CMP within normal limits CBC within normal limits with eosinophils mildly elevated at 9.3%.  vitamin B12 380. Folic acid 6.8..    MRI Brain without contrast 07/04/13:   Mild chronic microvascular changes and cerebral volume loss, age appropriate.   Mild mucosal thickening is noted at the maxillary sinuses bilaterally. Mild mucosal thickening is also noted in the ethmoid air cells. Frontal and sphenoid sinuses are clear. There is moderate bilateral turbinate hypertrophy.    MRI Thoracic spine without contrast 07/04/13:  1. Schmorl's node inferior endplate of T8. High T2 signal in the disc and vertebral body suggests acute of subacute end plate herniation. Correlate clinically for localized back pain.   2. Mild epidural lipomatosis mid thoracic spine resulting in mild to moderate central canal stenosis.     MRI cervical spine 07/04/13:  C3 - 4 level: Combined degenerative disc and facet changes result in moderate bilateral foraminal stenosis and mild central canal stenosis.  C4 - 5 level: Facet arthropathy results in mild to moderate bilateral foraminal stenosis.  C5 - 6 level: Combined degenerative disc and facet changes result in moderately severe bilateral foraminal stenosis and mild central canal stenosis.    CT scan of the brain 06/23/13: No CT evidence of mass effect, extra-axial fluid collection or hemorrhage in the brain. No evidence of acute infarct, bleed or significant mass.            ASSESSMENT:  Matthew Greene is a very pleasant 83 year old man with a history of hyperlipidemia with trigeminal neuralgia that   occurs relatively infrequently and responds well to gabapentin 100 to 200 mg 2 to 3 times per day.    There was associated left-sided head pain with a focal area of tenderness around the temporal   artery.MRI brain (02/2016) showed no acute changes. His trigeminal neuralgia is currently not  symptomatic.  The   trigger for his symptoms is unclear.          PLAN:  - I advised him that I would recommend he remain off of gabapentin unless he has pain. I reviewed that gabapentin can have potential side effects and that I would not recommend taking medication unless truly needed.  I advised that if he gets any premonition of trigeminal neuralgia related pain, then he can resume the gabapentin.  He tends to take low doses with gabapentin 100 to 200 mg.  I reviewed potential side effects of gabapentin with him once again including sedation, dizziness, and imbalance.  -He asked about surgical treatments for trigeminal neuralgia.  I advised that there are surgical treatments for trigeminal neuralgia, but would not recommend them for episodic trigeminal neuralgia that is responsive to oral medication.  - I recommended he speak to his primary care doctor regarding his constipation if it persists off of pain medications.  -Given he  is not currently having neurologic symptoms, I will plan to see him for follow-up on an as-needed basis if there is recurrence of headaches or symptoms of trigeminal neuralgia.I encouraged him to contact me with any questions or concerns he may have.          Greater than 25 minutes were spent with the patient, of which more than 50% of the visit was spent on counseling or coordination of care.      Please note: this note was generated using a voice recognition system.

## 2018-05-15 ENCOUNTER — Encounter: Payer: Self-pay | Admitting: Neurology

## 2018-05-15 ENCOUNTER — Ambulatory Visit (INDEPENDENT_AMBULATORY_CARE_PROVIDER_SITE_OTHER): Payer: Medicare Other | Admitting: Neurology

## 2018-05-15 VITALS — BP 151/91 | HR 74 | Temp 98.0°F | Resp 19 | Ht 66.5 in | Wt 208.0 lb

## 2018-05-15 DIAGNOSIS — G500 Trigeminal neuralgia: Secondary | ICD-10-CM

## 2018-05-15 DIAGNOSIS — R519 Headache, unspecified: Secondary | ICD-10-CM

## 2018-05-15 MED ORDER — GABAPENTIN 100 MG OR CAPS: 100.00 mg | ORAL_CAPSULE | Freq: Every day | ORAL | Status: AC | PRN

## 2018-05-20 ENCOUNTER — Ambulatory Visit: Payer: Medicare Other | Admitting: Neurology

## 2018-09-04 ENCOUNTER — Ambulatory Visit
Admit: 2018-09-04 | Discharge: 2018-09-04 | Payer: MEDICARE | Attending: Student in an Organized Health Care Education/Training Program | Primary: Student in an Organized Health Care Education/Training Program

## 2018-09-04 DIAGNOSIS — H25813 Combined forms of age-related cataract, bilateral: Secondary | ICD-10-CM

## 2018-09-04 MED ORDER — FLUORESCEIN-BENOXINATE 0.25-0.4 % OP SOLN
Freq: Once | OPHTHALMIC | Status: AC
Start: 2018-09-04 — End: 2018-09-04
  Administered 2018-09-04: 15:00:00 1 [drp] via OPHTHALMIC

## 2018-09-04 MED ORDER — PHENYLEPHRINE HCL 2.5 % OP SOLN
2.5 % | Freq: Once | OPHTHALMIC | Status: AC
Start: 2018-09-04 — End: 2018-09-04
  Administered 2018-09-04: 15:00:00 1 [drp] via OPHTHALMIC

## 2018-09-04 MED ORDER — TROPICAMIDE 1 % OP SOLN
1 % | Freq: Once | OPHTHALMIC | Status: AC
Start: 2018-09-04 — End: 2018-09-04
  Administered 2018-09-04: 15:00:00 1 [drp] via OPHTHALMIC

## 2018-09-04 NOTE — Progress Notes (Signed)
OPHTHALMOLOGY VISIT                            Date of Service:  09/04/2018  Primary Care Provider:  No primary care provider on file.    Patient Active Problem List    Diagnosis Date Noted   ??? Ocular hypertension of left eye 09/04/2018   ??? Hyperopia of both eyes with astigmatism and presbyopia 08/16/2017   ??? Meibomian gland dysfunction (MGD) 10/05/2015   ??? Pinguecula, left eye 10/05/2015   ??? Dermatochalasis of both upper eyelids 10/05/2015   ??? Senile cataracts of both eyes 06/07/2015     Assessment and Plan:      Diagnosis Orders   1. Combined forms of age-related cataract of both eyes     2. Hyperopia of both eyes with astigmatism and presbyopia     3. Dermatochalasis of both upper eyelids     4. Ocular hypertension of left eye           Senile cataracts of both eyes  - May be visually significant but patient continues to deny symptoms. He denies any issues with vision and his case worker also states that pt has never complained about his vision.  - Monitor yearly.    Hyperopia of both eyes with astigmatism and presbyopia  - Patient feels he does not need glasses.  - Offered MRx but declined.    Dermatochalasis of both upper eyelids  - Patient unbothered by this. Continue to monitor.    Ocular hypertension of left eye  - IOP 19 OD, 26 and 29 OS with tonopen today. Squeezing hard.  - IOP elevated for the first time. Will re-check in 6 months.  - Follow up in 6 months for Gen, IOP, short check.      Return in about 6 months (around 03/05/2019) for General, IOP check.    Discussed and seen with: Dr. Emogene Morgan.    Jerlyn Ly, MD  [facility]   09/04/18  ______________________________________________________________________  Chief Complaint: Blurred Vision and Other (Yearly eye exam.)      History of Present illness:     HPI     Blurred Vision     In both eyes.              Other      Additional comments: Yearly eye exam.              Comments     83 years old male here for yearly eye exam. Pt c/o blurry va ou  on/off both distance and near. Had discussion in past about CE and glasses but patient still defers both and feels he sees fine and does everything he wants to do. Case workers states he's never complained about his vision. Pt denies any burning, itching and tearing ou. Pt sts not on any eye gtts.                Review of Systems:   Review of Systems   Constitutional: Negative for fever.   HENT: Negative for congestion.    Eyes: Negative for photophobia, pain, redness and visual disturbance.   Neurological: Negative for headaches.      Medical/Surgical History:     Past Medical History:   Diagnosis Date   ??? Anxiety    ??? Attention deficit disorder    ??? Bipolar 1 disorder (HCC)    ??? Blurry vision    ??? Cataract    ???  High cholesterol    ??? Mental retardation    ??? Visual floaters      History reviewed. No pertinent surgical history.  Drug Allergies:   No Known Allergies  Medications:     Current Outpatient Medications   Medication Sig Dispense Refill   ??? b complex vitamins capsule Take 1 capsule by mouth daily     ??? ferrous sulfate 325 (65 Fe) MG tablet Take 325 mg by mouth daily (with breakfast)     ??? calcium-vitamin D (OSCAL) 250-125 MG-UNIT per tablet Take 1 tablet by mouth daily     ??? rosuvastatin (CRESTOR) 10 MG tablet Take 10 mg by mouth daily     ??? fluvoxaMINE maleate (LUVOX CR) 100 MG CP24 capsule Take 100 mg by mouth nightly     ??? nystatin 100000 UNIT/GM POWD Apply topically daily     ??? QUEtiapine (SEROQUEL) 25 MG tablet Take 25 mg by mouth 2 times daily     ??? carBAMazepine (TEGRETOL) 200 MG tablet Take 200 mg by mouth 2 times daily     ??? acetaminophen (TYLENOL) 500 MG tablet Take 500 mg by mouth every 6 hours as needed for Pain       No current facility-administered medications for this visit.       Vital Signs:    There were no vitals filed for this visit.  Physical Exam:     Visual Acuity (Snellen - Linear)       Right Left    Dist sc 20/80 20/80 +1    Dist ph sc 20/80 +2 NI         Not recorded           Pupils       Dark Light APD    Right 3 2 None    Left 3 2 None        Extraocular Movement     Extraocular Movement       Right Left     Full Full              Confrontational Visual Fields     Visual Fields       Right Left     Full Full              Tonometry (tonopen, 9:41 AM)       Right Left    Pressure 19 26      Tonometry #2 (Tonopen, 10:40 AM)       Right Left    Pressure  29      Tonometry Comments    Squeezing hard        Not recorded         Not recorded        Main Ophthalmology Exam     External Exam       Right Left    External Normal Normal          Slit Lamp Exam       Right Left    Lids/Lashes 2+ Dermatochalasis - upper lid 2+ Dermatochalasis - upper lid    Conjunctiva/Sclera White and quiet White and quiet    Cornea Arcus Arcus    Anterior Chamber Deep and quiet Deep and quiet    Iris Round and dilated Round and dilated    Lens 2-3+ NS, 2+ CS not in visual axis 2-3+ Nuclear sclerosis, 2+ CS not in visual axis, 1+ central PSC, pigment on PC  Fundus Exam       Right Left    Vitreous Vitreous syneresis Vitreous syneresis    Disc Peripapillary atrophy Peripapillary atrophy    C/D Ratio 0.2 0.25    Macula Normal Normal    Vessels Normal Normal    Periphery Normal Normal              No annotated images are attached to the encounter.    Return in about 6 months (around 03/05/2019) for General, IOP check.

## 2018-09-04 NOTE — Assessment & Plan Note (Signed)
-   May be visually significant but patient continues to deny symptoms. He denies any issues with vision and his case worker also states that pt has never complained about his vision.  - Monitor yearly.

## 2018-09-04 NOTE — Assessment & Plan Note (Signed)
-   Patient unbothered by this. Continue to monitor.

## 2018-09-04 NOTE — Assessment & Plan Note (Signed)
-   Patient feels he does not need glasses.  - Offered MRx but declined.

## 2018-09-04 NOTE — Assessment & Plan Note (Signed)
-   IOP 19 OD, 26 and 29 OS with tonopen today. Squeezing hard.  - IOP elevated for the first time. Will re-check in 6 months.  - Follow up in 6 months for Gen, IOP, short check.

## 2019-08-31 DEATH — deceased

## 2019-09-30 DEATH — deceased

## 2020-02-11 ENCOUNTER — Ambulatory Visit
Admit: 2020-02-11 | Discharge: 2020-02-11 | Payer: MEDICARE | Attending: Ophthalmology | Primary: Student in an Organized Health Care Education/Training Program

## 2020-02-11 DIAGNOSIS — H40052 Ocular hypertension, left eye: Secondary | ICD-10-CM

## 2020-03-17 NOTE — Other (Unsigned)
Patient Acct Nbr: [id number]   Primary AUTH/CERT:   Primary Insurance Company Name: [employer]  Primary Insurance Plan name: Medicare A  Primary Insurance Group Number:   Primary Insurance Plan Type: [place] A  Primary Insurance Policy Number: [id number]    Secondary AUTH/CERT:   Secondary Insurance Company Name: [employer]  Secondary Insurance Plan name: Medicare B  Secondary Insurance Group Number:   Secondary Insurance Plan Type: [employer], newspaper Insurance Policy Number: [id number]    Tertiary AUTH/CERT:   [employer] Name: [employer] Plan name: [employer] Group Number:   [employer] Plan Type: [employer] Number: [phone]

## 2020-08-23 ENCOUNTER — Encounter: Attending: Ophthalmology | Primary: Student in an Organized Health Care Education/Training Program

## 2020-10-06 ENCOUNTER — Ambulatory Visit
Admit: 2020-10-06 | Discharge: 2020-10-06 | Payer: MEDICARE | Attending: Ophthalmology | Primary: Student in an Organized Health Care Education/Training Program

## 2020-10-06 DIAGNOSIS — H40052 Ocular hypertension, left eye: Secondary | ICD-10-CM

## 2020-10-06 NOTE — Progress Notes (Addendum)
[facility]  [facility]  [address]  [place] [address]  Dept: [phone]  Dept Fax: [phone]  Loc: [phone]     Visit type: Established patient    Reason for Visit: Other (Ocular hypertension os)         Assessment and Plan       1. Ocular hypertension of left eye  2. Combined forms of age-related cataract of both eyes  3. Hyperopia of both eyes with astigmatism and presbyopia    Ocular hypertension of left eye - first noted 08/2018  ?? Squeezing even w tonopen - so probably artifact  ?? Overview/plan:  First noted 08/2018, but always squeezing when IOP checked even w tonopen - so probably artifact .  Small C:D.  Can follow w dilated and OCT.  Will not be able to accomplish VF.   ?? IOP OD/OS  Tmax 33/29 squeezing  Today 33/22 squeezing  ?? CCT check next visit  ?? OCT:  01/2020  ?? C:D: small  ?? Dilated exam last in April 2021    Combined forms of age-related cataract of both eyes - comfortable w current vision  Hyperopia of both eyes with astigmatism and presbyopia  ?? Visually significant - but not bothered by [facility], with case worker  ?? Could use readers even at distance    Reviewed chart - previous ophthalmology notes; problem list overview updated       Return in about 6 months (around 04/06/2021) for dilated eye exam, IOP, CCT Pachymetry, OCT ONH OU.         Patient Active Problem List    Diagnosis Date Noted   ??? Ocular hypertension of left eye 09/04/2018   ??? Hyperopia of both eyes with astigmatism and presbyopia 08/16/2017   ??? Meibomian gland dysfunction (MGD) 10/05/2015   ??? Pinguecula, left eye 10/05/2015   ??? Dermatochalasis of both upper eyelids 10/05/2015   ??? Combined forms of age-related cataract of both eyes 06/07/2015         Subjective       HPI     Other      Additional comments: Ocular hypertension os              Comments     85 years old male with Hx of ocular hypertension os here for iop check and DOA ou. sts no change in va ou since last  visit. sts no burning, itching, tearing, floaters or flashes of light ou.  Not on any eye gtts.                   ROS     Positive for: Eyes           No Known Allergies    Outpatient Medications Prior to Visit   Medication Sig Dispense Refill   ??? b complex vitamins capsule Take 1 capsule by mouth daily     ??? ferrous sulfate 325 (65 Fe) MG tablet Take 325 mg by mouth daily (with breakfast)     ??? calcium-vitamin D (OSCAL) 250-125 MG-UNIT per tablet Take 1 tablet by mouth daily     ??? rosuvastatin (CRESTOR) 10 MG tablet Take 10 mg by mouth daily     ??? fluvoxaMINE maleate (LUVOX CR) 100 MG CP24 capsule Take 100 mg by mouth nightly     ??? nystatin 100000 UNIT/GM POWD Apply topically daily     ??? QUEtiapine (SEROQUEL) 25 MG tablet  Take 25 mg by mouth 2 times daily     ??? carBAMazepine (TEGRETOL) 200 MG tablet Take 200 mg by mouth 2 times daily     ??? acetaminophen (TYLENOL) 500 MG tablet Take 500 mg by mouth every 6 hours as needed for Pain       No facility-administered medications prior to visit.        Past Medical History:   Diagnosis Date   ??? Anxiety    ??? Attention deficit disorder    ??? Bipolar 1 disorder (HCC)    ??? Cataract    ??? High cholesterol    ??? Mental retardation         Social History     Tobacco Use   ??? Smoking status: Former Smoker   ??? Smokeless tobacco: Never Used   ??? Tobacco comment: quit about 30 years ago.   Substance Use Topics   ??? Alcohol use: No        History reviewed. No pertinent surgical history.    Family History   Family history unknown: Yes       Objective       Base Eye Exam     Visual Acuity (Snellen - Linear)       Right Left    Dist sc 20/80 +1 20/200    Dist ph sc 20/60 -2 20/80          Tonometry (tonopen, 9:30 AM)       Right Left    Pressure 33 22          Pupils       Dark Light APD    Right 3 2 None    Left 3 2 None          Neuro/Psych     Oriented x3: Yes    Mood/Affect: Normal            Slit Lamp and Fundus Exam     External Exam       Right Left    External Normal Normal           Slit Lamp Exam       Right Left    Lids/Lashes 2+ Dermatochalasis - upper lid 2+ Dermatochalasis - upper lid    Conjunctiva/Sclera Conjunctivochalasis Conjunctivochalasis    Cornea Arcus Arcus    Anterior Chamber Deep and quiet Deep and quiet    Iris Round and dilated Round and dilated    Lens 2-3+ NS, 2+ CS not in visual axis 2-3+ Nuclear sclerosis, 2+ CS not in visual axis, 1+ central PSC, pigment on PC    Vitreous Normal Normal                   Data Reviewed and Summarized       Labs:     Imaging/Testing:        Araceli Bouche, MD

## 2020-10-12 NOTE — Other (Unsigned)
Patient Acct Nbr: [id number]   Primary AUTH/CERT:   Primary Insurance Company Name: [employer]  Primary Insurance Plan name: Medicare A  Primary Insurance Group Number:   Primary Insurance Plan Type: [place] A  Primary Insurance Policy Number: [id number]    Secondary AUTH/CERT:   Secondary Insurance Company Name: [employer]  Secondary Insurance Plan name: Medicare B  Secondary Insurance Group Number:   Secondary Insurance Plan Type: [employer], newspaper Insurance Policy Number: [id number]    Tertiary AUTH/CERT:   [employer] Name: [employer] Plan name: [employer] Group Number:   [employer] Plan Type: [employer] Number: [phone]
